# Patient Record
Sex: Male | Born: 1990 | Race: White | Hispanic: No | Marital: Single | Smoking: Never smoker
Health system: Southern US, Community
[De-identification: ages and names within clinical notes are randomized; demographics above are authoritative.]

## PROBLEM LIST (undated history)

## (undated) DIAGNOSIS — G4733 Obstructive sleep apnea (adult) (pediatric): Secondary | ICD-10-CM

## (undated) DIAGNOSIS — I480 Paroxysmal atrial fibrillation: Secondary | ICD-10-CM

## (undated) HISTORY — DX: Obstructive sleep apnea (adult) (pediatric): G47.33

## (undated) HISTORY — DX: Paroxysmal atrial fibrillation: I48.0

---

## 2020-08-02 ENCOUNTER — Emergency Department
Admission: EM | Admit: 2020-08-02 | Discharge: 2020-08-02 | Disposition: A | Discharge status: Home / Self Care | Payer: Managed Care, Other (non HMO) | Attending: Emergency Medicine | Admitting: Emergency Medicine

## 2020-08-02 ENCOUNTER — Other Ambulatory Visit: Payer: Self-pay

## 2020-08-02 ENCOUNTER — Emergency Department: Payer: Managed Care, Other (non HMO)

## 2020-08-02 DIAGNOSIS — Z20822 Contact with and (suspected) exposure to covid-19: Secondary | ICD-10-CM | POA: Diagnosis not present

## 2020-08-02 DIAGNOSIS — I480 Paroxysmal atrial fibrillation: Secondary | ICD-10-CM

## 2020-08-02 DIAGNOSIS — R002 Palpitations: Secondary | ICD-10-CM

## 2020-08-02 LAB — TROPONIN I (HIGH SENSITIVITY): Troponin I (High Sensitivity): 6 ng/L (ref <18)

## 2020-08-02 LAB — CBC
HCT: 48.8 % (ref 39.0–52.0)
Hemoglobin: 16.5 g/dL (ref 13.0–17.0)
MCH: 31.7 pg (ref 26.0–34.0)
MCHC: 33.8 g/dL (ref 30.0–36.0)
MCV: 93.8 fL (ref 80.0–100.0)
Platelets: 187 10*3/uL (ref 150–400)
RBC: 5.2 MIL/uL (ref 4.22–5.81)
RDW: 12.1 % (ref 11.5–15.5)
WBC: 5.8 10*3/uL (ref 4.0–10.5)
nRBC: 0 % (ref 0.0–0.2)

## 2020-08-02 LAB — BASIC METABOLIC PANEL
Anion gap: 11 (ref 5–15)
BUN: 10 mg/dL (ref 6–20)
CO2: 26 mmol/L (ref 22–32)
Calcium: 9.6 mg/dL (ref 8.9–10.3)
Chloride: 102 mmol/L (ref 98–111)
Creatinine, Ser: 1.05 mg/dL (ref 0.61–1.24)
GFR, Estimated: >60 mL/min (ref >60)
Glucose, Bld: 128 mg/dL — ABNORMAL HIGH (ref 70–99)
Potassium: 3.1 mmol/L — ABNORMAL LOW (ref 3.5–5.1)
Sodium: 139 mmol/L (ref 135–145)

## 2020-08-02 LAB — MAGNESIUM: Magnesium: 2.1 mg/dL (ref 1.7–2.4)

## 2020-08-02 LAB — TSH: TSH: 3.755 u[IU]/mL (ref 0.350–4.500)

## 2020-08-02 LAB — SARS CORONAVIRUS 2 (TAT 6-24 HRS): SARS Coronavirus 2: NEGATIVE

## 2020-08-02 MED ORDER — LACTATED RINGERS IV BOLUS
1000.0000 mL | Freq: Once | INTRAVENOUS | Status: AC
  Administered 2020-08-02: 1000 mL via INTRAVENOUS

## 2020-08-02 MED ORDER — POTASSIUM CHLORIDE CRYS ER 20 MEQ PO TBCR
40.0000 meq | EXTENDED_RELEASE_TABLET | Freq: Once | ORAL | Status: AC
  Administered 2020-08-02: 40 meq via ORAL
  Filled 2020-08-02: qty 2

## 2020-08-02 NOTE — ED Triage Notes (Signed)
Pt presents c/o tachycardia and palpitations. Denies chest pain.

## 2020-08-02 NOTE — ED Provider Notes (Signed)
[facility] Emergency Department Provider Note  ____________________________________________  Time seen: Approximately 12:24 PM  I have reviewed the triage vital signs and the nursing notes.   HISTORY  Chief Complaint Palpitations    HPI Kent Gibbs is a 30 y.o. male who presents to the emergency department complaining of palpitations.  Patient states that he had a sudden change earlier this morning when he felt like his heart was beating fast and heavy Nedra Hai.  Patient denied any pain with this.  Patient states that he had no shortness of breath.  He has no cardiac history.  He has not take any medications illicit or prescribed.  Patient has no close family members with a cardiac history.  He states that again it is a palpitations/racing sensation.  No shortness of breath, no cough.  No URI symptoms.  No recent illnesses.  Patient states that he does drink caffeine but does not drink an abnormal amount of caffeine prior to the onset of symptoms.  He was not doing anything out of the ordinary when they began.  While here, patient states that the sensation went away completely.  Since then he has had no return of the symptoms.  No medications prior to arrival.         No past medical history on file.  There are no problems to display for this patient.     Prior to Admission medications   Not on File    Allergies Patient has no known allergies.  No family history on file.  Social History     Review of Systems  Constitutional: No fever/chills Eyes: No visual changes. No discharge ENT: No upper respiratory complaints. Cardiovascular: Palpitations and "racing" sensation Respiratory: no cough. No SOB. Gastrointestinal: No abdominal pain.  No nausea, no vomiting.  No diarrhea.  No constipation. Musculoskeletal: Negative for musculoskeletal pain. Skin: Negative for rash, abrasions, lacerations, ecchymosis. Neurological: Negative for headaches, focal  weakness or numbness.  10 System ROS otherwise negative.  ____________________________________________   PHYSICAL EXAM:  VITAL SIGNS: ED Triage Vitals [08/02/20 1058]  Enc Vitals Group     BP (!) 135/106     Pulse Rate (!) 133     Resp 14     Temp 98.6 F (37 C)     Temp Source Oral     SpO2 100 %     Weight      Height      Head Circumference      Peak Flow      Pain Score 0     Pain Loc      Pain Edu?      Excl. in GC?      Constitutional: Alert and oriented. Well appearing and in no acute distress. Eyes: Conjunctivae are normal. PERRL. EOMI. Head: Atraumatic. ENT:      Ears:       Nose: No congestion/rhinnorhea.      Mouth/Throat: Mucous membranes are moist.  Neck: No stridor.    Cardiovascular: Normal rate, regular rhythm. Normal S1 and S2.  Good peripheral circulation. Respiratory: Normal respiratory effort without tachypnea or retractions. Lungs CTAB. Good air entry to the bases with no decreased or absent breath sounds. Gastrointestinal: Bowel sounds 4 quadrants. Soft and nontender to palpation. No guarding or rigidity. No palpable masses. No distention. No CVA tenderness. Musculoskeletal: Full range of motion to all extremities. No gross deformities appreciated. Neurologic:  Normal speech and language. No gross focal neurologic deficits are appreciated.  Skin:  Skin is warm, dry and intact. No rash noted. Psychiatric: Mood and affect are normal. Speech and behavior are normal. Patient exhibits appropriate insight and judgement.   ____________________________________________   LABS (all labs ordered are listed, but only abnormal results are displayed)  Labs Reviewed  BASIC METABOLIC PANEL - Abnormal; Notable for the following components:      Result Value   Potassium 3.1 (*)    Glucose, Bld 128 (*)    All other components within normal limits  SARS CORONAVIRUS 2 (TAT 6-24 HRS)  CBC  MAGNESIUM  TSH  URINALYSIS, COMPLETE (UACMP) WITH MICROSCOPIC   URINE DRUG SCREEN, QUALITATIVE ([facility] ONLY)  TROPONIN I (HIGH SENSITIVITY)  TROPONIN I (HIGH SENSITIVITY)   ____________________________________________  EKG  ED ECG REPORT I, Delorise Royals Verania Salberg,  personally viewed and interpreted this ECG.   Date: 08/02/2020  EKG Time: 1045 hrs.  Rate: 132 bpm  Rhythm: there are no previous tracings available for comparison, atrial fibrillation, rate 132  Axis: Normal axis  Intervals:none  ST&T Change: No ST elevation or depression noted  A. fib with RVR at a rate of 132 bpm.  No STEMI.  Patient had incomplete right bundle branch block with RSR pattern in V1 V2 with no widened QRS  ED ECG REPORT I, Delorise Royals Micky Overturf,  personally viewed and interpreted this ECG.   Date: 08/02/2020  EKG Time: 1316 hrs.  Rate: 94 bpm  Rhythm: normal sinus rhythm, no longer in A. Fib.,  RSR pattern in V1 and V2, no widened QRS  Axis: Normal axis  Intervals:none  ST&T Change: No ST elevation or depression  Normal sinus rhythm.  Compared to previous EKG earlier today no longer in A. fib.  RSR pattern still identified with no evidence of right bundle branch block.    ____________________________________________  RADIOLOGY I personally viewed and evaluated these images as part of my medical decision making, as well as reviewing the written report by the radiologist.  ED Provider Interpretation: Concur with radiologist finding of no acute cardiopulmonary abnormality  DG Chest 2 View  Result Date: 08/02/2020 CLINICAL DATA:  Acute tachycardia and palpitations. EXAM: CHEST - 2 VIEW COMPARISON:  None. FINDINGS: The cardiomediastinal silhouette is unremarkable. There is no evidence of focal airspace disease, pulmonary edema, suspicious pulmonary nodule/mass, pleural effusion, or pneumothorax. No acute bony abnormalities are identified. IMPRESSION: No active cardiopulmonary disease. Electronically Signed   By: Harmon Pier M.D.   On: 08/02/2020 11:42     ____________________________________________    PROCEDURES  Procedure(s) performed:    Procedures    Medications  potassium chloride SA (KLOR-CON) CR tablet 40 mEq (40 mEq Oral Given 08/02/20 1235)  lactated ringers bolus 1,000 mL (0 mLs Intravenous Stopped 08/02/20 1505)     ____________________________________________   INITIAL IMPRESSION / ASSESSMENT AND PLAN / ED COURSE  Pertinent labs & imaging results that were available during my care of the patient were reviewed by me and considered in my medical decision making (see chart for details).  Review of the [place] CSRS was performed in accordance of the [facility] prior to dispensing any controlled drugs.  Clinical Course as of 08/02/20 1526  Sun Aug 02, 2020  1519 Patient arrived with palpitations and heart racing sensation.  No describe chest pain.  No cardiac history.  No medications.  Patient arrived with A. fib with a rate of 132 bpm.  On my assessment, patient states that sensation had resolved.  Monitor showed a normal sinus rhythm with a rate  of 82.  Patient had a repeat EKG which revealed sinus rhythm at a rate of 94 bpm.  Patient has maintained with a pulse rate between 76 and 84 and normal sinus rhythm throughout the remainder of his visit. [JC]    Clinical Course User Index [JC] Beretta Ginsberg, Delorise Royals, PA-C          Patient's diagnosis is consistent with palpitations, paroxysmal A. fib.  Patient presented to emergency department with palpitations and rapid heart rate.  Patient had A. fib on initial EKG.  Patient had spontaneous conversion without medication or other interventions.  He states that symptoms had gone away on my assessment of the patient.  Patient had remained at a heart rate between 76 and 84 bpm and normal sinus rhythm for the duration of his ED visit.  Remainder of the work-up including labs, EKG and chest x-ray was reassuring.  At this time I will not start the patient on anticoagulation as he  spontaneous converted out of A. fib.  No history of A. fib in the past.  Patient did have slightly low potassium and was given oral resupplementation here in the emergency department.  Patient was encouraged to use a potassium-containing diet at home.  Given strict return precautions to the patient to return for any new or worsening symptoms.  Otherwise follow-up with cardiology. Patient is given ED precautions to return to the ED for any worsening or new symptoms.     ____________________________________________  FINAL CLINICAL IMPRESSION(S) / ED DIAGNOSES  Final diagnoses:  Palpitations  Paroxysmal A-fib (HCC)      NEW MEDICATIONS STARTED DURING THIS VISIT:  ED Discharge Orders    None          This chart was dictated using voice recognition software/Dragon. Despite best efforts to proofread, errors can occur which can change the meaning. Any change was purely unintentional.    Racheal Patches, PA-C 08/02/20 1526    Gilles Chiquito, MD 08/02/20 [phone]

## 2020-08-14 ENCOUNTER — Ambulatory Visit (INDEPENDENT_AMBULATORY_CARE_PROVIDER_SITE_OTHER): Payer: Managed Care, Other (non HMO)

## 2020-08-14 ENCOUNTER — Ambulatory Visit: Payer: Managed Care, Other (non HMO) | Admitting: Cardiology

## 2020-08-14 ENCOUNTER — Encounter: Payer: Self-pay | Admitting: Cardiology

## 2020-08-14 ENCOUNTER — Other Ambulatory Visit: Payer: Self-pay

## 2020-08-14 VITALS — BP 140/70 | HR 78 | Ht 76.0 in | Wt 177.0 lb

## 2020-08-14 DIAGNOSIS — I480 Paroxysmal atrial fibrillation: Secondary | ICD-10-CM

## 2020-08-14 NOTE — Patient Instructions (Signed)
Medication Instructions:  Your physician has recommended you make the following change in your medication:   1.  START Toprol XL 25 MG once daily.   *If you need a refill on your cardiac medications before your next appointment, please call your pharmacy*   Lab Work: None ordered If you have labs (blood work) drawn today and your tests are completely normal, you will receive your results only by: Marland Kitchen MyChart Message (if you have MyChart) OR . A paper copy in the mail If you have any lab test that is abnormal or we need to change your treatment, we will call you to review the results.   Testing/Procedures:  1.  Your physician has requested that you have an echocardiogram. Echocardiography is a painless test that uses sound waves to create images of your heart. It provides your doctor with information about the size and shape of your heart and how well your heart's chambers and valves are working. This procedure takes approximately one hour. There are no restrictions for this procedure.  2.  Your physician has recommended that you wear a Zio monitor for 2 weeks. This monitor is a medical device that records the heart's electrical activity. Doctors most often use these monitors to diagnose arrhythmias. Arrhythmias are problems with the speed or rhythm of the heartbeat. The monitor is a small device applied to your chest. You can wear one while you do your normal daily activities. While wearing this monitor if you have any symptoms to push the button and record what you felt. Once you have worn this monitor for the period of time provider prescribed (Usually 14 days), you will return the monitor device in the postage paid box. Once it is returned they will download the data collected and provide [place] with a report which the provider will then review and we will call you with those results. Important tips:  1. Avoid showering during the first 24 hours of wearing the monitor. 2. Avoid excessive  sweating to help maximize wear time. 3. Do not submerge the device, no hot tubs, and no swimming pools. 4. Keep any lotions or oils away from the patch. 5. After 24 hours you may shower with the patch on. Take brief showers with your back facing the shower head.  6. Do not remove patch once it has been placed because that will interrupt data and decrease adhesive wear time. 7. Push the button when you have any symptoms and write down what you were feeling. 8. Once you have completed wearing your monitor, remove and place into box which has postage paid and place in your outgoing mailbox.  9. If for some reason you have misplaced your box then call our office and we can provide another box and/or mail it off for you.           Follow-Up: At [facility], you and your health needs are our priority.  As part of our continuing mission to provide you with exceptional heart care, we have created designated Provider Care Teams.  These Care Teams include your primary Cardiologist (physician) and Advanced Practice Providers (APPs -  Physician Assistants and Nurse Practitioners) who all work together to provide you with the care you need, when you need it.  We recommend signing up for the patient portal called "MyChart".  Sign up information is provided on this After Visit Summary.  MyChart is used to connect with patients for Virtual Visits (Telemedicine).  Patients are able to view lab/test results,  upcoming appointments, etc.  Non-urgent messages can be sent to your provider as well.   To learn more about what you can do with MyChart, go to [email or web address].    Your next appointment:   6 week(s)  The format for your next appointment:   In Person  Provider:   Brian Agbor-Etang, MD   Other Instructions   

## 2020-08-14 NOTE — Progress Notes (Signed)
Cardiology Office Note:    Date:  08/14/2020   ID:  Kent Gibbs, DOB [birth year], MRN [id number]  PCP:  [facility]  Cardiologist:  No primary care provider on file.  Advanced Practice Provider:  No care team member to display Electrophysiologist:  None       Referring MD: Gilles Chiquito, MD   Chief Complaint  Patient presents with  . Other    Follow up ED visit - Afib and palpitations. Meds reviewed verbally with patient.    Kent Gibbs is a 30 y.o. male who is being seen today for the evaluation of palpitations at the request of Gilles Chiquito, MD.   History of Present Illness:    Kent Gibbs is a 30 y.o. male with no significant past medical history who presents due to palpitations.  Patient was seen in the ED about 2 weeks ago on 08/02/2020 due to palpitations.  He was at home when he suddenly felt his heart racing.  Symptoms of tachycardia/palpitations were associated with dizziness.  Denies chest pain or shortness of breath.  Symptoms lasted about 3 hours.  In the ED, EKG showed atrial fibrillation with heart rate 132.  Patient spontaneously converted to normal sinus rhythm while in the emergency room.  Has a low CHA2DS2-VASc score of 0.  No anticoagulation was indicated.  States having palpitations after developing Covid in January 2021.   History reviewed. No pertinent past medical history.  History reviewed. No pertinent surgical history.  Current Medications: No outpatient medications have been marked as taking for the 08/14/20 encounter (Office Visit) with Debbe Odea, MD.     Allergies:   Patient has no known allergies.   Social History   Socioeconomic History  . Marital status: Single    Spouse name: Not on file  . Number of children: Not on file  . Years of education: Not on file  . Highest education level: Not on file  Occupational History  . Not on file  Tobacco Use  . Smoking status: Never Smoker  .  Smokeless tobacco: Never Used  Vaping Use  . Vaping Use: Never used  Substance and Sexual Activity  . Alcohol use: Never  . Drug use: Never  . Sexual activity: Not on file  Other Topics Concern  . Not on file  Social History Narrative  . Not on file   Social Determinants of Health   Financial Resource Strain: Not on file  Food Insecurity: Not on file  Transportation Needs: Not on file  Physical Activity: Not on file  Stress: Not on file  Social Connections: Not on file     Family History: The patient's family history is not on file.  ROS:   Please see the history of present illness.     All other systems reviewed and are negative.  EKGs/Labs/Other Studies Reviewed:    The following studies were reviewed today:   EKG:  EKG is  ordered today.  The ekg ordered today demonstrates normal sinus rhythm, normal ECG.  Recent Labs: 08/02/2020: BUN 10; Creatinine, Ser 1.05; Hemoglobin 16.5; Magnesium 2.1; Platelets 187; Potassium 3.1; Sodium 139; TSH 3.755  Recent Lipid Panel No results found for: CHOL, TRIG, HDL, CHOLHDL, VLDL, LDLCALC, LDLDIRECT   Risk Assessment/Calculations:      Physical Exam:    VS:  BP 140/70 (BP Location: Left Arm, Patient Position: Sitting, Cuff Size: Normal)   Pulse 78   Ht 6\' 4"  (1.93 m)  Wt 177 lb (80.3 kg)   SpO2 98%   BMI 21.55 kg/m     Wt Readings from Last 3 Encounters:  08/14/20 177 lb (80.3 kg)     GEN:  Well nourished, well developed in no acute distress HEENT: Normal NECK: No JVD; No carotid bruits LYMPHATICS: No lymphadenopathy CARDIAC: RRR, no murmurs, rubs, gallops RESPIRATORY:  Clear to auscultation without rales, wheezing or rhonchi  ABDOMEN: Soft, non-tender, non-distended MUSCULOSKELETAL:  No edema; No deformity  SKIN: Warm and dry NEUROLOGIC:  Alert and oriented x 3 PSYCHIATRIC:  Normal affect   ASSESSMENT:    1. Paroxysmal atrial fibrillation (HCC)    PLAN:    In order of problems listed  above:  1. Paroxysmal atrial fibrillation, history of palpitations.  CHA2DS2-VASc score 0.  Get echocardiogram.  No indication for anticoagulation at this point due to low CHA2DS2-VASc score.  Start Toprol-XL 25 mg daily to suppress arrhythmia recurrence.  Place cardiac monitor to evaluate A. fib burden.  Since patient has very symptomatic symptoms when in A. fib, also very young, will refer to EP for additional input regarding antiarrhythmic therapy and/or ablation.   Follow-up after echocardiogram, cardiac monitor.     Medication Adjustments/Labs and Tests Ordered: Current medicines are reviewed at length with the patient today.  Concerns regarding medicines are outlined above.  Orders Placed This Encounter  Procedures  . Ambulatory referral to Cardiac Electrophysiology  . LONG TERM MONITOR (3-14 DAYS)  . EKG 12-Lead  . ECHOCARDIOGRAM COMPLETE   No orders of the defined types were placed in this encounter.   Patient Instructions  Medication Instructions:  Your physician has recommended you make the following change in your medication:   1.  START Toprol XL 25 MG once daily.   *If you need a refill on your cardiac medications before your next appointment, please call your pharmacy*   Lab Work: None ordered If you have labs (blood work) drawn today and your tests are completely normal, you will receive your results only by: Marland Kitchen MyChart Message (if you have MyChart) OR . A paper copy in the mail If you have any lab test that is abnormal or we need to change your treatment, we will call you to review the results.   Testing/Procedures:  1.  Your physician has requested that you have an echocardiogram. Echocardiography is a painless test that uses sound waves to create images of your heart. It provides your doctor with information about the size and shape of your heart and how well your heart's chambers and valves are working. This procedure takes approximately one hour. There are  no restrictions for this procedure.  2.  Your physician has recommended that you wear a Zio monitor for 2 weeks. This monitor is a medical device that records the heart's electrical activity. Doctors most often use these monitors to diagnose arrhythmias. Arrhythmias are problems with the speed or rhythm of the heartbeat. The monitor is a small device applied to your chest. You can wear one while you do your normal daily activities. While wearing this monitor if you have any symptoms to push the button and record what you felt. Once you have worn this monitor for the period of time provider prescribed (Usually 14 days), you will return the monitor device in the postage paid box. Once it is returned they will download the data collected and provide [place] with a report which the provider will then review and we will call you with those results. Important  tips:  1. Avoid showering during the first 24 hours of wearing the monitor. 2. Avoid excessive sweating to help maximize wear time. 3. Do not submerge the device, no hot tubs, and no swimming pools. 4. Keep any lotions or oils away from the patch. 5. After 24 hours you may shower with the patch on. Take brief showers with your back facing the shower head.  6. Do not remove patch once it has been placed because that will interrupt data and decrease adhesive wear time. 7. Push the button when you have any symptoms and write down what you were feeling. 8. Once you have completed wearing your monitor, remove and place into box which has postage paid and place in your outgoing mailbox.  9. If for some reason you have misplaced your box then call our office and we can provide another box and/or mail it off for you.           Follow-Up: At [facility], you and your health needs are our priority.  As part of our continuing mission to provide you with exceptional heart care, we have created designated Provider Care Teams.  These Care Teams include your  primary Cardiologist (physician) and Advanced Practice Providers (APPs -  Physician Assistants and Nurse Practitioners) who all work together to provide you with the care you need, when you need it.  We recommend signing up for the patient portal called "MyChart".  Sign up information is provided on this After Visit Summary.  MyChart is used to connect with patients for Virtual Visits (Telemedicine).  Patients are able to view lab/test results, encounter notes, upcoming appointments, etc.  Non-urgent messages can be sent to your provider as well.   To learn more about what you can do with MyChart, go to [email or web address].    Your next appointment:   6 week(s)  The format for your next appointment:   In Person  Provider:   Debbe Odea, MD   Other Instructions      Signed, Debbe Odea, MD  08/14/2020 1:02 PM    [facility]

## 2020-08-17 ENCOUNTER — Telehealth: Payer: Self-pay | Admitting: Cardiology

## 2020-08-17 MED ORDER — METOPROLOL SUCCINATE ER 25 MG PO TB24: 25.0000 mg | ORAL_TABLET | Freq: Every day | ORAL | 5 refills | Status: DC

## 2020-08-17 NOTE — Telephone Encounter (Signed)
*  STAT* If patient is at the pharmacy, call can be transferred to refill team.   1. Which medications need to be refilled? (please list name of each medication and dose if known) Toprolol 25 mg  2. Which pharmacy/location (including street and city if local pharmacy) is medication to be sent to? [facility] on [address]  3. Do they need a 30 day or 90 day supply? 30  Patient was seen on 3/4 and medication was not called in after visit

## 2020-08-17 NOTE — Telephone Encounter (Signed)
Called patient and informed him that the prescription has been sent in.

## 2020-09-03 ENCOUNTER — Other Ambulatory Visit: Payer: Self-pay

## 2020-09-03 ENCOUNTER — Ambulatory Visit (INDEPENDENT_AMBULATORY_CARE_PROVIDER_SITE_OTHER): Payer: Managed Care, Other (non HMO)

## 2020-09-03 DIAGNOSIS — I480 Paroxysmal atrial fibrillation: Secondary | ICD-10-CM

## 2020-09-03 LAB — ECHOCARDIOGRAM COMPLETE
AR max vel: 3.77 cm2
AV Area VTI: 3.97 cm2
AV Area mean vel: 3.61 cm2
AV Mean grad: 3 mmHg
AV Peak grad: 4.7 mmHg
Ao pk vel: 1.08 m/s
Area-P 1/2: 3.91 cm2

## 2020-09-03 MED ORDER — PERFLUTREN LIPID MICROSPHERE
1.0000 mL | INTRAVENOUS | Status: AC | PRN
  Administered 2020-09-03: 2 mL via INTRAVENOUS

## 2020-09-07 ENCOUNTER — Telehealth: Payer: Self-pay

## 2020-09-07 NOTE — Telephone Encounter (Signed)
Called and left patient a VM to call back so I can give the echo result note.

## 2020-09-07 NOTE — Telephone Encounter (Signed)
Patient called back and was given the echo result note.

## 2020-09-09 ENCOUNTER — Ambulatory Visit: Payer: Managed Care, Other (non HMO) | Admitting: Cardiology

## 2020-09-09 ENCOUNTER — Telehealth: Payer: Self-pay

## 2020-09-09 ENCOUNTER — Encounter: Payer: Self-pay | Admitting: Cardiology

## 2020-09-09 VITALS — BP 132/68 | HR 83 | Ht 76.0 in | Wt 177.5 lb

## 2020-09-09 DIAGNOSIS — I480 Paroxysmal atrial fibrillation: Secondary | ICD-10-CM

## 2020-09-09 NOTE — Telephone Encounter (Signed)
   Date:  09/09/2020 STOP BANG RISK ASSESSMENT S (snore) Have you been told that you snore?     NO   T (tired) Are you often tired, fatigued, or sleepy during the day?   YES  O (obstruction) Do you stop breathing, choke, or gasp during sleep? NO   P (pressure) Do you have or are you being treated for high blood pressure? NO   B (BMI) Is your body index greater than 35 kg/m? NO   A (age) Are you 29 years old or older? NO   N (neck) Do you have a neck circumference greater than 16 inches?   YES   G (gender) Are you a male? YES   TOTAL STOP/BANG "YES" ANSWERS                                                                        For Office Use Only              Procedure Order Form    YES to 3+ Stop Bang questions OR two clinical symptoms - patient qualifies for WatchPAT (CPT 95800)

## 2020-09-09 NOTE — Patient Instructions (Addendum)
Medication Instructions:  Your physician recommends that you continue on your current medications as directed. Please refer to the Current Medication list given to you today. *If you need a refill on your cardiac medications before your next appointment, please call your pharmacy*  Lab Work: None ordered. If you have labs (blood work) drawn today and your tests are completely normal, you will receive your results only by: Marland Kitchen MyChart Message (if you have MyChart) OR . A paper copy in the mail If you have any lab test that is abnormal or we need to change your treatment, we will call you to review the results.  Testing/Procedures: Your physician has recommended that you have a sleep study. This test records several body functions during sleep, including: brain activity, eye movement, oxygen and carbon dioxide blood levels, heart rate and rhythm, breathing rate and rhythm, the flow of air through your mouth and nose, snoring, body muscle movements, and chest and belly movement.  Follow-Up: At [facility], you and your health needs are our priority.  As part of our continuing mission to provide you with exceptional heart care, we have created designated Provider Care Teams.  These Care Teams include your primary Cardiologist (physician) and Advanced Practice Providers (APPs -  Physician Assistants and Nurse Practitioners) who all work together to provide you with the care you need, when you need it.  Your next appointment:   Your physician wants you to follow-up in: 6 months with Dr. Lalla Brothers.

## 2020-09-09 NOTE — Telephone Encounter (Signed)
Order placed for sleep study  Sent to precert  Will continue to follow.

## 2020-09-09 NOTE — Progress Notes (Signed)
Electrophysiology Office Note:    Date:  09/09/2020   ID:  Kent Gibbs, DOB January 22, 1991, MRN [id number]  PCP:  Pcp, No  [facility] Cardiologist:  No primary care provider on file.  [facility] Electrophysiologist:  Lanier Prude, MD   Referring MD: Debbe Odea, MD   Chief Complaint: Paroxysmal atrial fibrillation  History of Present Illness:    Kent Gibbs is a 30 y.o. male who presents for an evaluation of paroxysmal atrial fibrillation at the request of Dr. Azucena Cecil.  The medical history includes a COVID-19 infection in January 2021.  Since his COVID-19 infection, he has had palpitations.  His CHA2DS2-VASc is 0.  He was last seen by Dr. Azucena Cecil on August 14, 2020.  At that appointment, a ZIO monitor was ordered which showed a 10% burden of atrial fibrillation with ventricular rates ranging from 58 to 192 bpm.  Longest episode lasted 7.5 hours.  Patient triggered episodes corresponded to episodes of atrial fibrillation.  Patient tells me he is a bit of a night owl.  He is symptomatic when he is in atrial fibrillation and feels like his heart is racing.  No syncope or lightheadedness.  He tells me that for the last week and a half he has not had any episodes of atrial fibrillation.  He is unsure whether or not he snores at night. No past medical history on file.  No past surgical history on file.  Current Medications: Current Meds  Medication Sig  . metoprolol succinate (TOPROL XL) 25 MG 24 hr tablet Take 1 tablet (25 mg total) by mouth daily.     Allergies:   Patient has no known allergies.   Social History   Socioeconomic History  . Marital status: Single    Spouse name: Not on file  . Number of children: Not on file  . Years of education: Not on file  . Highest education level: Not on file  Occupational History  . Not on file  Tobacco Use  . Smoking status: Never Smoker  . Smokeless tobacco: Never Used  Vaping Use  . Vaping Use: Never used   Substance and Sexual Activity  . Alcohol use: Never  . Drug use: Never  . Sexual activity: Not on file  Other Topics Concern  . Not on file  Social History Narrative  . Not on file   Social Determinants of Health   Financial Resource Strain: Not on file  Food Insecurity: Not on file  Transportation Needs: Not on file  Physical Activity: Not on file  Stress: Not on file  Social Connections: Not on file     Family History: The patient's family history is not on file.  ROS:   Please see the history of present illness.    All other systems reviewed and are negative.  EKGs/Labs/Other Studies Reviewed:    The following studies were reviewed today:  September 04, 2020 ZIO monitor personally reviewed Patient had a min HR of 46 bpm, max HR of 192 bpm, and avg HR of 77 bpm. Predominant underlying rhythm was Sinus Rhythm. Atrial Fibrillation occurred (10% burden), ranging from 58-192 bpm (avg of 109 bpm), the longest lasting 7 hours 30 mins with an avg  rate of 98 bpm. Atrial Fibrillation was detected within +/- 45 seconds of symptomatic patient event(s). Isolated SVEs were rare (<1.0%), SVE Couplets were rare (<1.0%), and SVE Triplets were rare (<1.0%). Isolated VEs were rare (<1.0%), VE Couplets were  rare (<1.0%), and no VE Triplets were  present.    Heart rate variability shows a wide fluctuation in heart rates at night.    September 03, 2020 echo personally reviewed Left ventricular function normal, 60% Right ventricular function normal No significant valvular abnormalities   EKG:  The ekg ordered today demonstrates sinus rhythm with sinus arrhythmia, incomplete right bundle branch block  Recent Labs: 08/02/2020: BUN 10; Creatinine, Ser 1.05; Hemoglobin 16.5; Magnesium 2.1; Platelets 187; Potassium 3.1; Sodium 139; TSH 3.755  Recent Lipid Panel No results found for: CHOL, TRIG, HDL, CHOLHDL, VLDL, LDLCALC, LDLDIRECT  Physical Exam:    VS:  BP 132/68 (BP Location: Left Arm,  Patient Position: Sitting, Cuff Size: Normal)   Pulse 83   Ht 6\' 4"  (1.93 m)   Wt 177 lb 8 oz (80.5 kg)   SpO2 99%   BMI 21.61 kg/m     Wt Readings from Last 3 Encounters:  09/09/20 177 lb 8 oz (80.5 kg)  08/14/20 177 lb (80.3 kg)     GEN:  Well nourished, well developed in no acute distress HEENT: Normal NECK: No JVD; No carotid bruits LYMPHATICS: No lymphadenopathy CARDIAC: RRR, no murmurs, rubs, gallops RESPIRATORY:  Clear to auscultation without rales, wheezing or rhonchi  ABDOMEN: Soft, non-tender, non-distended MUSCULOSKELETAL:  No edema; No deformity  SKIN: Warm and dry NEUROLOGIC:  Alert and oriented x 3 PSYCHIATRIC:  Normal affect   ASSESSMENT:    1. Paroxysmal atrial fibrillation (HCC)    PLAN:    In order of problems listed above:  1. Paroxysmal atrial fibrillation Symptomatic.  10% burden. During today's visit, discussed the management options available for treating his paroxysmal atrial fibrillation including continued conservative management, antiarrhythmic therapy or ablation therapy.  I discussed the risks and benefits and expected efficacy of the 3 options.  For now, we will plan to continue conservative management with metoprolol given he is not had any episodes of atrial fibrillation in the last 1 and half weeks.  We will plan to touch base in 6 months.  Given the wide fluctuation in his heart rates at night, we will plan to order a sleep study.  I have encouraged him to pursue some form of at home heart rhythm monitoring.  We discussed alive core, apple watch, Fitbit.  I think this would help guide future management decisions.  Follow-up 6 months.   Total time spent with patient today 65 minutes. This includes reviewing records, evaluating the patient and coordinating care.  Medication Adjustments/Labs and Tests Ordered: Current medicines are reviewed at length with the patient today.  Concerns regarding medicines are outlined above.  Orders  Placed This Encounter  Procedures  . EKG 12-Lead   No orders of the defined types were placed in this encounter.    Signed, 10/14/20, MD, [facility]  09/09/2020 2:29 PM    Electrophysiology [facility]

## 2020-09-10 NOTE — Telephone Encounter (Signed)
3.31.2022 10:50 Call to [employer], no precert required for CPT 95800.  Confirmation # [id number].  Patient notified, PIN provided, instructions reviewed, patient denied additional questions and will proceed with testing.  Jim Like MHA RN CCM

## 2020-09-11 ENCOUNTER — Encounter (INDEPENDENT_AMBULATORY_CARE_PROVIDER_SITE_OTHER): Payer: Managed Care, Other (non HMO) | Admitting: Cardiology

## 2020-09-11 DIAGNOSIS — G4733 Obstructive sleep apnea (adult) (pediatric): Secondary | ICD-10-CM

## 2020-09-14 ENCOUNTER — Ambulatory Visit: Payer: Managed Care, Other (non HMO)

## 2020-09-14 DIAGNOSIS — I480 Paroxysmal atrial fibrillation: Secondary | ICD-10-CM

## 2020-09-14 NOTE — Procedures (Signed)
   Sleep Study Report  Patient Information  Name: Kent Gibbs  ID: [id number] Birth Date: [birth year]  Age: 30  Gender:Male Insurer: BMI: 21.5 (W=176 lb, H=6' 4'') Study Date:09/11/2020 Referring Physician:  Steffanie Dunn, MD  TEST DESCRIPTION: Home sleep apnea testing was completed using the WatchPat, a Type 1 device, utilizing  peripheral arterial tonometry (PAT), chest movement, actigraphy, pulse oximetry, pulse rate, body position and snore.  AHI was calculated with apnea and hypopnea using valid sleep time as the denominator. RDI includes apneas,  hypopneas, and RERAs. The data acquired and the scoring of sleep and all associated events were performed in  accordance with the recommended standards and specifications as outlined in the AASM Manual for the Scoring of  Sleep and Associated Events 2.2.0 (2015). FINDINGS: 1. Mild Obstructive Sleep Apnea with AHI 8/hr.  2. NoCentral Sleep Apnea with pAHIc 0/hr. 3. Oxygen desaturations as low as 88%. 4. Mild snoring was present. O2 sats were < 88% for . 5. Total sleep time was 5hrs and 40 min. 6. 23.5% of total sleep time was spent in REM sleep.  7. Delayed sleep onset latency at 30 min.  8. Prolonged REM sleep onset latency at 169 min.  9. Total awakenings were 6.   DIAGNOSIS:  Mild Obstructive Sleep Apnea (G47.33)  RECOMMENDATIONS: 1. Clinical correlation of these findings is necessary. The decision to treat obstructive sleep apnea (OSA) is usually  based on the presence of apnea symptoms or the presence of associated medical conditions such as Hypertension,  Congestive Heart Failure, Atrial Fibrillation or Obesity. The most common symptoms of OSA are snoring, gasping for  breath while sleeping, daytime sleepiness and fatigue.   2. Initiating apnea therapy is recommended given the presence of symptoms and/or associated conditions.   Recommend proceeding with one of the following:  a. Auto-CPAP therapy with a  pressure range of 5-20cm H2O.   b. An oral appliance (OA) that can be obtained from certain dentists with expertise in sleep medicine. These are  primarily of use in non-obese patients with mild and moderate disease.   c. An ENT consultation which may be useful to look for specific causes of obstruction and possible treatment  Options.   d. If patient is intolerant to PAP therapy, consider referral to ENT for evaluation for hypoglossal nerve stimulator.  3. Close follow-up is necessary to ensure success with CPAP or oral appliance therapy for maximum benefit .  4. A follow-up oximetry study on CPAP is recommended to assess the adequacy of therapy and determine the need  for supplemental oxygen or the potential need for Bi-level therapy. An arterial blood gas to determine the adequacy of  baseline ventilation and oxygenation should also be considered.  5. Healthy sleep recommendations include: adequate nightly sleep (normal 7-9 hrs/night), avoidance of caffeine after  noon and alcohol near bedtime, and maintaining a sleep environment that is cool, dark and quiet.  6. Weight loss for overweight patients is recommended. Even modest amounts of weight loss can significantly  improve the severity of sleep apnea.  7. Snoring recommendations include: weight loss where appropriate, side sleeping, and avoidance of alcohol before  Bed.  8. Operation of motor vehicle or dangerous equipment must be avoided when feeling drowsy, excessively sleepy, or  mentally fatigued.  Report prepared by:  Signature: Armanda Magic, MD [facility], Diplomat American Board of Sleep Medicine  Electronically Signed: Apr 4, 202

## 2020-09-18 ENCOUNTER — Telehealth: Payer: Self-pay

## 2020-09-18 NOTE — Telephone Encounter (Signed)
Patient calling back stating he saw his results in [place] and does not have any further questions

## 2020-09-18 NOTE — Telephone Encounter (Signed)
Margrett Rud, [place]  09/18/2020 4:25 PM EDT Back to Top     No answer. LMOV. Phone Note Started.    Debbe Odea, MD  09/18/2020 1:13 PM EDT      A. fib noted on cardiac monitor, 10% burden, associated with patient triggered events. Keep follow-up appointment with myself and also electrophysiology.

## 2020-09-18 NOTE — Telephone Encounter (Signed)
Noted  

## 2020-09-22 ENCOUNTER — Telehealth: Payer: Self-pay | Admitting: *Deleted

## 2020-09-22 DIAGNOSIS — G4733 Obstructive sleep apnea (adult) (pediatric): Secondary | ICD-10-CM

## 2020-09-22 NOTE — Telephone Encounter (Signed)
-----   Message from Quintella Reichert, MD sent at 09/14/2020  5:12 PM EDT ----- Please let patient know that they have sleep apnea.  Order ResMed auto CPAP from 4 to 15cm H2O with heated humidity and mask of choice. Followup with me in 6 weeks.

## 2020-09-22 NOTE — Telephone Encounter (Signed)
Informed patient of sleep study results and patient understanding was verbalized. Patient understands his sleep study showed they have sleep apnea.  Dr says Order ResMed auto CPAP from 4 to 15cm H2O with heated humidity and mask of choice. Followup with me in 6 weeks.     Upon patient request DME selection is [facility]. Patient understands she/he will be contacted by [facility] to set up her/he cpap. Patient understands to call if [facility] does not contact her/he with new setup in a timely manner. Patient understands they will be called once confirmation has been received from [facility] that they have received their new machine to schedule 10 week follow up appointment.   [facility] notified of new cpap order  Please add to airview Patient was grateful for the call and thanked me.

## 2020-09-25 ENCOUNTER — Encounter: Payer: Self-pay | Admitting: Cardiology

## 2020-09-25 ENCOUNTER — Ambulatory Visit (INDEPENDENT_AMBULATORY_CARE_PROVIDER_SITE_OTHER): Payer: Managed Care, Other (non HMO) | Admitting: Cardiology

## 2020-09-25 ENCOUNTER — Other Ambulatory Visit: Payer: Self-pay

## 2020-09-25 VITALS — BP 120/66 | HR 76 | Ht 76.0 in | Wt 176.0 lb

## 2020-09-25 DIAGNOSIS — I480 Paroxysmal atrial fibrillation: Secondary | ICD-10-CM | POA: Diagnosis not present

## 2020-09-25 DIAGNOSIS — G4733 Obstructive sleep apnea (adult) (pediatric): Secondary | ICD-10-CM | POA: Diagnosis not present

## 2020-09-25 NOTE — Progress Notes (Signed)
Cardiology Office Note:    Date:  09/25/2020   ID:  Kent Gibbs, DOB 06-Mar-1991, MRN [id number]  PCP:  [facility]  Cardiologist:  No primary care provider on file.  Advanced Practice Provider:  No care team member to display Electrophysiologist:  Lanier Prude, MD       Referring MD: No ref. provider found   Chief Complaint  Patient presents with  . Other    6 week follow up. Meds reviewed verbally with patient.     History of Present Illness:    Kent Gibbs is a 30 y.o. male with history of paroxysmal atrial fibrillation, OSA who presents for follow-up.  Previously seen due to palpitations and A. fib.  CHA2DS2-VASc score of 0, not on anticoagulation.  Cardiac monitor was placed, echocardiogram was ordered to evaluate any significant cardiac abnormalities.  Was also evaluated by EP due to symptomatic A. fib.  Symptoms seem to have improved, conservative measures with Toprol-XL advised.  Has no concerns currently.  States rarely having symptoms of palpitations.  Due to A. fib, sleep study was ordered which revealed mild OSA, currently in the process of obtaining CPAP machine..   Prior notes  in the ED, EKG showed atrial fibrillation with heart rate 132.  Patient spontaneously converted to normal sinus rhythm while in the emergency room.  Has a low CHA2DS2-VASc score of 0.  No anticoagulation was indicated.  States having palpitations after developing Covid in January 2021.   History reviewed. No pertinent past medical history.  History reviewed. No pertinent surgical history.  Current Medications: Current Meds  Medication Sig  . metoprolol succinate (TOPROL XL) 25 MG 24 hr tablet Take 1 tablet (25 mg total) by mouth daily.     Allergies:   Patient has no known allergies.   Social History   Socioeconomic History  . Marital status: Single    Spouse name: Not on file  . Number of children: Not on file  . Years of education: Not on  file  . Highest education level: Not on file  Occupational History  . Not on file  Tobacco Use  . Smoking status: Never Smoker  . Smokeless tobacco: Never Used  Vaping Use  . Vaping Use: Never used  Substance and Sexual Activity  . Alcohol use: Never  . Drug use: Never  . Sexual activity: Not on file  Other Topics Concern  . Not on file  Social History Narrative  . Not on file   Social Determinants of Health   Financial Resource Strain: Not on file  Food Insecurity: Not on file  Transportation Needs: Not on file  Physical Activity: Not on file  Stress: Not on file  Social Connections: Not on file     Family History: The patient's family history is not on file.  ROS:   Please see the history of present illness.     All other systems reviewed and are negative.  EKGs/Labs/Other Studies Reviewed:    The following studies were reviewed today:   EKG:  EKG not  ordered today.   Recent Labs: 08/02/2020: BUN 10; Creatinine, Ser 1.05; Hemoglobin 16.5; Magnesium 2.1; Platelets 187; Potassium 3.1; Sodium 139; TSH 3.755  Recent Lipid Panel No results found for: CHOL, TRIG, HDL, CHOLHDL, VLDL, LDLCALC, LDLDIRECT   Risk Assessment/Calculations:      Physical Exam:    VS:  BP 120/66 (BP Location: Left Arm, Patient Position: Sitting, Cuff Size: Normal)  Pulse 76   Ht 6\' 4"  (1.93 m)   Wt 176 lb (79.8 kg)   SpO2 99%   BMI 21.42 kg/m     Wt Readings from Last 3 Encounters:  09/25/20 176 lb (79.8 kg)  09/09/20 177 lb 8 oz (80.5 kg)  08/14/20 177 lb (80.3 kg)     GEN:  Well nourished, well developed in no acute distress HEENT: Normal NECK: No JVD; No carotid bruits LYMPHATICS: No lymphadenopathy CARDIAC: RRR, no murmurs, rubs, gallops RESPIRATORY:  Clear to auscultation without rales, wheezing or rhonchi  ABDOMEN: Soft, non-tender, non-distended MUSCULOSKELETAL:  No edema; No deformity  SKIN: Warm and dry NEUROLOGIC:  Alert and oriented x 3 PSYCHIATRIC:  Normal  affect   ASSESSMENT:    1. Paroxysmal atrial fibrillation (HCC)   2. OSA (obstructive sleep apnea)    PLAN:    In order of problems listed above:  1. Paroxysmal atrial fibrillation, history of palpitations.  CHA2DS2-VASc score 0.  Echocardiogram showed normal systolic and diastolic function, EF 60 to 65%.  Cardiac monitor did reveal A. fib, 10% burden.  Appreciate input from EP.  Continue conservative management with Toprol-XL.  Treatment of OSA with CPAP will help A. fib burden. 2. OSA, management with CPAP recommended.  Follow-up in 12 months.     Medication Adjustments/Labs and Tests Ordered: Current medicines are reviewed at length with the patient today.  Concerns regarding medicines are outlined above.  No orders of the defined types were placed in this encounter.  No orders of the defined types were placed in this encounter.   Patient Instructions  Medication Instructions:  Your physician recommends that you continue on your current medications as directed. Please refer to the Current Medication list given to you today.  *If you need a refill on your cardiac medications before your next appointment, please call your pharmacy*   Lab Work: None ordered If you have labs (blood work) drawn today and your tests are completely normal, you will receive your results only by: 10/14/20 MyChart Message (if you have MyChart) OR . A paper copy in the mail If you have any lab test that is abnormal or we need to change your treatment, we will call you to review the results.   Testing/Procedures: None ordered   Follow-Up: At [facility], you and your health needs are our priority.  As part of our continuing mission to provide you with exceptional heart care, we have created designated Provider Care Teams.  These Care Teams include your primary Cardiologist (physician) and Advanced Practice Providers (APPs -  Physician Assistants and Nurse Practitioners) who all work together to  provide you with the care you need, when you need it.  We recommend signing up for the patient portal called "MyChart".  Sign up information is provided on this After Visit Summary.  MyChart is used to connect with patients for Virtual Visits (Telemedicine).  Patients are able to view lab/test results, encounter notes, upcoming appointments, etc.  Non-urgent messages can be sent to your provider as well.   To learn more about what you can do with MyChart, go to [facility].    Your next appointment:   1 year(s)  The format for your next appointment:   In Person  Provider:   [email or web address], MD   Other Instructions       Signed, Debbe Odea, MD  09/25/2020 4:58 PM    [facility]

## 2020-09-25 NOTE — Patient Instructions (Signed)

## 2021-01-04 ENCOUNTER — Other Ambulatory Visit: Payer: Self-pay | Admitting: *Deleted

## 2021-01-04 MED ORDER — METOPROLOL SUCCINATE ER 25 MG PO TB24: 25.0000 mg | ORAL_TABLET | Freq: Every day | ORAL | 5 refills | Status: DC

## 2021-01-11 NOTE — Telephone Encounter (Signed)
Ok PER [employer] ,valid dates are 01-08-21- 07/07/21 aut #[id number], REFERENCE [id number]

## 2021-01-25 ENCOUNTER — Ambulatory Visit: Payer: Managed Care, Other (non HMO) | Admitting: Cardiology

## 2021-02-09 ENCOUNTER — Ambulatory Visit: Payer: Managed Care, Other (non HMO) | Admitting: Cardiology

## 2021-02-09 ENCOUNTER — Encounter: Payer: Self-pay | Admitting: Cardiology

## 2021-02-09 ENCOUNTER — Other Ambulatory Visit: Payer: Self-pay

## 2021-02-09 VITALS — BP 112/68 | HR 82 | Ht 76.0 in | Wt 181.6 lb

## 2021-02-09 DIAGNOSIS — G4733 Obstructive sleep apnea (adult) (pediatric): Secondary | ICD-10-CM

## 2021-02-09 DIAGNOSIS — I480 Paroxysmal atrial fibrillation: Secondary | ICD-10-CM | POA: Insufficient documentation

## 2021-02-09 NOTE — Progress Notes (Signed)
Cardiology Office Note:    Date:  02/09/2021   ID:  Kent Gibbs, DOB [birth year], MRN [id number]  PCP:  Pcp, No  Cardiologist:  None    Referring MD: No ref. provider found   Chief Complaint  Patient presents with   Sleep Apnea     History of Present Illness:    Kent Gibbs is a 30 y.o. male with a hx of PAF who was referred for evaluation of OSA due to PAF.  He denies any hx of snoring, witnessed apnea or awakening gasping for breath.  He underwent home sleep study which showed mild OSA with an AHI of 8/hr and was started on auto CPAP and is now back for followup.  He has had a hard time getting adjusted to the CPAP.  He says that he is not used to having something on his face when he tries to sleep.  He is a stomach sleeper so it is hard to sleep on his side or back.  He does admit to feeling tired in the am but typically does not get 8 hours of sleep.  He usually goes to bed around MN or 1am and gets up around 8:30am and does not get up during the night.   Past Medical History:  Diagnosis Date   OSA (obstructive sleep apnea)    mild with AHI 8/hr>>on CPAP   PAF (paroxysmal atrial fibrillation) (HCC)     History reviewed. No pertinent surgical history.  Current Medications: Current Meds  Medication Sig   metoprolol succinate (TOPROL XL) 25 MG 24 hr tablet Take 1 tablet (25 mg total) by mouth daily.     Allergies:   Patient has no known allergies.   Social History   Socioeconomic History   Marital status: Single    Spouse name: Not on file   Number of children: Not on file   Years of education: Not on file   Highest education level: Not on file  Occupational History   Not on file  Tobacco Use   Smoking status: Never   Smokeless tobacco: Never  Vaping Use   Vaping Use: Never used  Substance and Sexual Activity   Alcohol use: Never   Drug use: Never   Sexual activity: Not on file  Other Topics Concern   Not on file  Social History Narrative   Not on file    Social Determinants of Health   Financial Resource Strain: Not on file  Food Insecurity: Not on file  Transportation Needs: Not on file  Physical Activity: Not on file  Stress: Not on file  Social Connections: Not on file     Family History: The patient's family history is not on file.  ROS:   Please see the history of present illness.    ROS  All other systems reviewed and negative.   EKGs/Labs/Other Studies Reviewed:    The following studies were reviewed today: Home sleep study, PAP download  EKG:  EKG is not ordered today.    Recent Labs: 08/02/2020: BUN 10; Creatinine, Ser 1.05; Hemoglobin 16.5; Magnesium 2.1; Platelets 187; Potassium 3.1; Sodium 139; TSH 3.755   Recent Lipid Panel No results found for: CHOL, TRIG, HDL, CHOLHDL, VLDL, LDLCALC, LDLDIRECT   Physical Exam:    VS:  BP 112/68   Pulse 82   Ht 6\' 4"  (1.93 m)   Wt 181 lb 9.6 oz (82.4 kg)   SpO2 99%   BMI 22.11 kg/m     Wt Readings  from Last 3 Encounters:  02/09/21 181 lb 9.6 oz (82.4 kg)  09/25/20 176 lb (79.8 kg)  09/09/20 177 lb 8 oz (80.5 kg)     GEN:  Well nourished, well developed in no acute distress HEENT: Normal NECK: No JVD; No carotid bruits LYMPHATICS: No lymphadenopathy CARDIAC: RRR, no murmurs, rubs, gallops RESPIRATORY:  Clear to auscultation without rales, wheezing or rhonchi  ABDOMEN: Soft, non-tender, non-distended MUSCULOSKELETAL:  No edema; No deformity  SKIN: Warm and dry NEUROLOGIC:  Alert and oriented x 3 PSYCHIATRIC:  Normal affect   ASSESSMENT:    1. OSA (obstructive sleep apnea)    PLAN:    In order of problems listed above:   OSA - The patient is tolerating PAP therapy well without any problems. The PAP download performed by his DME was personally reviewed and interpreted by me today and showed an AHI of 1.5/hr on auto PAP and 0% compliance in using more than 4 hours nightly.  The patient has been using and benefiting from PAP use and will continue to  benefit from therapy.  -I encouarged him to be more compliant with his device  Time Spent: 20 minutes total time of encounter, including 15 minutes spent in face-to-face patient care on the date of this encounter. This time includes coordination of care and counseling regarding above mentioned problem list. Remainder of non-face-to-face time involved reviewing chart documents/testing relevant to the patient encounter and documentation in the medical record. I have independently reviewed documentation from referring provider  Medication Adjustments/Labs and Tests Ordered: Current medicines are reviewed at length with the patient today.  Concerns regarding medicines are outlined above.  No orders of the defined types were placed in this encounter.  No orders of the defined types were placed in this encounter.  Followup with me in 2 months  Signed, Armanda Magic, MD  02/09/2021 10:16 AM    [facility]

## 2021-02-09 NOTE — Patient Instructions (Signed)
Medication Instructions:  Your physician recommends that you continue on your current medications as directed. Please refer to the Current Medication list given to you today.  *If you need a refill on your cardiac medications before your next appointment, please call your pharmacy*  Follow-Up: At [facility], you and your health needs are our priority.  As part of our continuing mission to provide you with exceptional heart care, we have created designated Provider Care Teams.  These Care Teams include your primary Cardiologist (physician) and Advanced Practice Providers (APPs -  Physician Assistants and Nurse Practitioners) who all work together to provide you with the care you need, when you need it.  Your next appointment:   2 month(s)  The format for your next appointment:   Virtual Visit   Provider:   Traci Turner, MD  

## 2021-03-10 ENCOUNTER — Ambulatory Visit: Payer: Managed Care, Other (non HMO) | Admitting: Cardiology

## 2021-03-30 NOTE — Progress Notes (Signed)
Electrophysiology Office Follow up Visit Note:    Date:  03/31/2021   ID:  Kent Gibbs, DOB [birth year], MRN [id number]  PCP:  Pcp, No  [facility] Cardiologist:  None  [facility] Electrophysiologist:  Lanier Prude, MD    Interval History:    Kent Gibbs is a 30 y.o. male who presents for a follow up visit. They were last seen in clinic September 09, 2020 for paroxysmal atrial fibrillation.  He had a 10% burden on a ZIO monitor. After appointment, we decided to continue with a conservative medical management strategy given he had not had an episode of atrial fibrillation in 1-1/2 weeks at the time of our appointment.  We plan to discuss his burden at a follow-up appointment today.  We also ordered a sleep study. The sleep study did return positive for obstructive sleep apnea and he was started on an auto CPAP.  On review of his medical equipment, he had a 0% compliance and using his CPAP machine.  Today he tells me he has not had a recurrence of his atrial fibrillation.  He is done well.  He did have a sleep study which showed mild sleep apnea and he has started CPAP using a nasal pillow CPAP mask.    Past Medical History:  Diagnosis Date   OSA (obstructive sleep apnea)    mild with AHI 8/hr>>on CPAP   PAF (paroxysmal atrial fibrillation) (HCC)     History reviewed. No pertinent surgical history.  Current Medications: No outpatient medications have been marked as taking for the 03/31/21 encounter (Office Visit) with Lanier Prude, MD.     Allergies:   Patient has no known allergies.   Social History   Socioeconomic History   Marital status: Single    Spouse name: Not on file   Number of children: Not on file   Years of education: Not on file   Highest education level: Not on file  Occupational History   Not on file  Tobacco Use   Smoking status: Never   Smokeless tobacco: Never  Vaping Use   Vaping Use: Never used  Substance and Sexual Activity    Alcohol use: Never   Drug use: Never   Sexual activity: Not on file  Other Topics Concern   Not on file  Social History Narrative   Not on file   Social Determinants of Health   Financial Resource Strain: Not on file  Food Insecurity: Not on file  Transportation Needs: Not on file  Physical Activity: Not on file  Stress: Not on file  Social Connections: Not on file     Family History: The patient's family history is not on file.  ROS:   Please see the history of present illness.    All other systems reviewed and are negative.  EKGs/Labs/Other Studies Reviewed:    The following studies were reviewed today: Sleep study results  EKG:  The ekg ordered today demonstrates sinus arrhythmia  Recent Labs: 08/02/2020: BUN 10; Creatinine, Ser 1.05; Hemoglobin 16.5; Magnesium 2.1; Platelets 187; Potassium 3.1; Sodium 139; TSH 3.755  Recent Lipid Panel No results found for: CHOL, TRIG, HDL, CHOLHDL, VLDL, LDLCALC, LDLDIRECT  Physical Exam:    VS:  Ht 6\' 4"  (1.93 m)   BMI 22.11 kg/m     Wt Readings from Last 3 Encounters:  02/09/21 181 lb 9.6 oz (82.4 kg)  09/25/20 176 lb (79.8 kg)  09/09/20 177 lb 8 oz (80.5 kg)     GEN:  Well nourished, well developed in no acute distress HEENT: Normal NECK: No JVD; No carotid bruits LYMPHATICS: No lymphadenopathy CARDIAC: RRR, no murmurs, rubs, gallops RESPIRATORY:  Clear to auscultation without rales, wheezing or rhonchi  ABDOMEN: Soft, non-tender, non-distended MUSCULOSKELETAL:  No edema; No deformity  SKIN: Warm and dry NEUROLOGIC:  Alert and oriented x 3 PSYCHIATRIC:  Normal affect        ASSESSMENT:    1. Paroxysmal atrial fibrillation (HCC)   2. OSA (obstructive sleep apnea)    PLAN:    In order of problems listed above:  #Paroxysmal atrial fibrillation No recurrence.  At this point, would favor a watchful waiting approach especially in light of his OSA diagnosis and recent initiation of CPAP.  If he has recurrence  of atrial arrhythmia, he will reach out to [place] and we can discuss next steps.   #Sleep apnea Continue CPAP.  Discussed the length between sleep apnea and A. fib during today's appointment.  Follow-up as needed.   Medication Adjustments/Labs and Tests Ordered: Current medicines are reviewed at length with the patient today.  Concerns regarding medicines are outlined above.  Orders Placed This Encounter  Procedures   EKG 12-Lead   No orders of the defined types were placed in this encounter.    Signed, Steffanie Dunn, MD, [facility], [facility] 03/31/2021 3:26 PM    Electrophysiology [facility]

## 2021-03-31 ENCOUNTER — Ambulatory Visit: Payer: Managed Care, Other (non HMO) | Admitting: Cardiology

## 2021-03-31 ENCOUNTER — Encounter: Payer: Self-pay | Admitting: Cardiology

## 2021-03-31 ENCOUNTER — Other Ambulatory Visit: Payer: Self-pay

## 2021-03-31 VITALS — BP 124/80 | HR 86 | Ht 76.0 in | Wt 179.0 lb

## 2021-03-31 DIAGNOSIS — I480 Paroxysmal atrial fibrillation: Secondary | ICD-10-CM | POA: Diagnosis not present

## 2021-03-31 DIAGNOSIS — G4733 Obstructive sleep apnea (adult) (pediatric): Secondary | ICD-10-CM | POA: Diagnosis not present

## 2021-03-31 NOTE — Patient Instructions (Addendum)

## 2021-04-25 NOTE — Progress Notes (Signed)
Virtual Visit via Video Note   This visit type was conducted due to national recommendations for restrictions regarding the COVID-19 Pandemic (e.g. social distancing) in an effort to limit this patient's exposure and mitigate transmission in our community.  Due to his co-morbid illnesses, this patient is at least at moderate risk for complications without adequate follow up.  This format is felt to be most appropriate for this patient at this time.  All issues noted in this document were discussed and addressed.  A limited physical exam was performed with this format.  Please refer to the patient's chart for his consent to telehealth for [facility].       Date:  04/26/2021   ID:  Kent Gibbs, DOB November 28, 1990, MRN [id number] The patient was identified using 2 identifiers.  Patient Location: Home Provider Location: Home Office   PCP:  Pcp, No   [facility] Providers Cardiologist:  None Electrophysiologist:  Lanier Prude, MD  Sleep Medicine:  Armanda Magic, MD     Evaluation Performed:  Follow-Up Visit  Chief Complaint:  OSA  History of Present Illness:    Kent Gibbs is a 30 y.o. male with a hx of PAF who was referred for evaluation of OSA due to PAF.  He denies any hx of snoring, witnessed apnea or awakening gasping for breath.  He underwent home sleep study which showed mild OSA with an AHI of 8/hr and was started on auto CPAP.Marland Kitchen I saw him back in August 2022 and had not been compliant with his device because he is a Surveyor, [employer] and it was hard to get adjusted to his device.    He is now back for followup.  He is still struggling with getting adjusted to his device due to sleeping on his stomach.  He has started dong better with it over the past week or so.  The pressure is fine.  The patient does not have symptoms concerning for COVID-19 infection (fever, chills, cough, or new shortness of breath).    Past Medical History:  Diagnosis Date   OSA (obstructive  sleep apnea)    mild with AHI 8/hr>>on CPAP   PAF (paroxysmal atrial fibrillation) (HCC)    No past surgical history on file.   No outpatient medications have been marked as taking for the 04/26/21 encounter (Video Visit) with Quintella Reichert, MD.     Allergies:   Patient has no known allergies.   Social History   Tobacco Use   Smoking status: Never   Smokeless tobacco: Never  Vaping Use   Vaping Use: Never used  Substance Use Topics   Alcohol use: Never   Drug use: Never     Family Hx: The patient's family history is not on file.  ROS:   Please see the history of present illness.     All other systems reviewed and are negative.   Prior CV studies:   The following studies were reviewed today:  PAP compliance download  Labs/Other Tests and Data Reviewed:    EKG:  No ECG reviewed.  Recent Labs: 08/02/2020: BUN 10; Creatinine, Ser 1.05; Hemoglobin 16.5; Magnesium 2.1; Platelets 187; Potassium 3.1; Sodium 139; TSH 3.755   Recent Lipid Panel No results found for: CHOL, TRIG, HDL, CHOLHDL, LDLCALC, LDLDIRECT  Wt Readings from Last 3 Encounters:  04/26/21 170 lb (77.1 kg)  03/31/21 179 lb (81.2 kg)  02/09/21 181 lb 9.6 oz (82.4 kg)      Objective:    Vital  Signs:  Ht 6\' 4"  (1.93 m)   Wt 170 lb (77.1 kg)   BMI 20.69 kg/m    VITAL SIGNS:  reviewed GEN:  no acute distress EYES:  sclerae anicteric, EOMI - Extraocular Movements Intact RESPIRATORY:  normal respiratory effort, symmetric expansion CARDIOVASCULAR:  no peripheral edema SKIN:  no rash, lesions or ulcers. MUSCULOSKELETAL:  no obvious deformities. NEURO:  alert and oriented x 3, no obvious focal deficit PSYCH:  normal affect  ASSESSMENT & PLAN:    OSA - he is still having difficulty getting used to the mask due to being a stomach sleeper. The PAP download performed by his DME was personally reviewed and interpreted by me today and showed an AHI of 3.2/hr on auto PAP  with 10% compliance in using  more than 4 hours nightly.  The patient has been using and benefiting from PAP use and will continue to benefit from therapy.  -we discussed other options for treatment of OSA today including Inspire and oral device -he is not a candidate for Inspire and he only has mild OSA and needs an AHI>15/hr to qualify -his best option would be referral to dentistry for oral device evaluation -he wants to try to continue to use the device and thinks that he can get compliant  COVID-19 Education: The signs and symptoms of COVID-19 were discussed with the patient and how to seek care for testing (follow up with PCP or arrange E-visit).  The importance of social distancing was discussed today.  Time:   Today, I have spent 15 minutes with the patient with telehealth technology discussing the above problems.     Medication Adjustments/Labs and Tests Ordered: Current medicines are reviewed at length with the patient today.  Concerns regarding medicines are outlined above.   Tests Ordered: No orders of the defined types were placed in this encounter.   Medication Changes: No orders of the defined types were placed in this encounter.   Follow Up:  Virtual Visit  in 4 month(s)  Signed, , MD  04/26/2021 10:58 AM    [facility]

## 2021-04-26 ENCOUNTER — Other Ambulatory Visit: Payer: Self-pay

## 2021-04-26 ENCOUNTER — Encounter: Payer: Self-pay | Admitting: Cardiology

## 2021-04-26 ENCOUNTER — Telehealth (INDEPENDENT_AMBULATORY_CARE_PROVIDER_SITE_OTHER): Payer: Managed Care, Other (non HMO) | Admitting: Cardiology

## 2021-04-26 VITALS — Ht 76.0 in | Wt 170.0 lb

## 2021-04-26 DIAGNOSIS — G4733 Obstructive sleep apnea (adult) (pediatric): Secondary | ICD-10-CM | POA: Diagnosis not present

## 2021-04-26 NOTE — Patient Instructions (Signed)
Medication Instructions:  Your physician recommends that you continue on your current medications as directed. Please refer to the Current Medication list given to you today.  *If you need a refill on your cardiac medications before your next appointment, please call your pharmacy*  Follow-Up: At [facility], you and your health needs are our priority.  As part of our continuing mission to provide you with exceptional heart care, we have created designated Provider Care Teams.  These Care Teams include your primary Cardiologist (physician) and Advanced Practice Providers (APPs -  Physician Assistants and Nurse Practitioners) who all work together to provide you with the care you need, when you need it.   Your next appointment:   4 month(s)  The format for your next appointment:   In Person  Provider:   Armanda Magic, MD

## 2021-08-25 NOTE — Progress Notes (Signed)
? ?Virtual Visit via Video Note  ? ?This visit type was conducted due to national recommendations for restrictions regarding the COVID-19 Pandemic (e.g. social distancing) in an effort to limit this patient's exposure and mitigate transmission in our community.  Due to his co-morbid illnesses, this patient is at least at moderate risk for complications without adequate follow up.  This format is felt to be most appropriate for this patient at this time.  All issues noted in this document were discussed and addressed.  A limited physical exam was performed with this format.  Please refer to the patient's chart for his consent to telehealth for [facility] ? ?  ?Date:  08/26/2021  ? ?ID:  Kent Gibbs, DOB 02-01-1991, MRN [id number] ?The patient was identified using 2 identifiers. ? ?Patient Location: Home ?Provider Location: Home Office ? ? ?PCP:  Pcp, No ?  ?[facility] Providers ?Cardiologist:  None ?Electrophysiologist:  Lanier Prude, MD  ?Sleep Medicine:  Armanda Magic, MD    ? ?Evaluation Performed:  Follow-Up Visit ? ?Chief Complaint:  OSA ? ?History of Present Illness:   ? ?Kent Gibbs is a 31 y.o. male with a hx of PAF who was referred for evaluation of OSA due to PAF.  He denies any hx of snoring, witnessed apnea or awakening gasping for breath.  He underwent home sleep study which showed mild OSA with an AHI of 8/hr and was started on auto CPAP.  He has had a hard time getting adjusted to using CPAP.  He is not a candidate for Inspire as his AHI is < 15/hr.  ? ?He is doing well with his CPAP device and thinks that he has gotten used to it.  He tolerates the mask and feels the pressure is adequate.  Since going on CPAP he feels rested in the am and has no significant daytime sleepiness.  He denies any significant mouth or nasal dryness or nasal congestion.  He does not think that he snores.    ? ?Past Medical History:  ?Diagnosis Date  ? OSA (obstructive sleep apnea)   ? mild with AHI 8/hr>>on  CPAP  ? PAF (paroxysmal atrial fibrillation) (HCC)   ? ?No past surgical history on file.  ? ?No outpatient medications have been marked as taking for the 08/26/21 encounter (Video Visit) with Quintella Reichert, MD.  ?  ? ?Allergies:   Patient has no known allergies.  ? ?Social History  ? ?Tobacco Use  ? Smoking status: Never  ? Smokeless tobacco: Never  ?Vaping Use  ? Vaping Use: Never used  ?Substance Use Topics  ? Alcohol use: Never  ? Drug use: Never  ?  ? ?Family Hx: ?The patient's family history is not on file. ? ?ROS:   ?Please see the history of present illness.    ? ?All other systems reviewed and are negative. ? ? ?Prior CV studies:   ?The following studies were reviewed today: ? ?PAP compliance download ? ?Labs/Other Tests and Data Reviewed:   ? ?EKG:  No ECG reviewed. ? ?Recent Labs: ?No results found for requested labs within last 8760 hours.  ? ?Recent Lipid Panel ?No results found for: CHOL, TRIG, HDL, CHOLHDL, LDLCALC, LDLDIRECT ? ?Wt Readings from Last 3 Encounters:  ?08/26/21 170 lb (77.1 kg)  ?04/26/21 170 lb (77.1 kg)  ?03/31/21 179 lb (81.2 kg)  ?  ? ? ?Objective:   ? ?Vital Signs:  Pulse 87   Ht 6\' 4"  (1.93 m)   Wt  170 lb (77.1 kg)   BMI 20.69 kg/m?   ?Well nourished, well developed male in no acute distress. ?Well appearing, alert and conversant, regular work of breathing,  good skin color  ?Eyes- anicteric ?mouth- oral mucosa is pink  ?neuro- grossly intact ?skin- no apparent rash or lesions or cyanosis  ?ASSESSMENT & PLAN:   ? ?OSA - The patient is tolerating PAP therapy well without any problems. The PAP download performed by his DME was personally reviewed and interpreted by me today and showed an AHI of 1.9/hr on auto CPAP with 23% compliance in using more than 4 hours nightly.  The patient has been using and benefiting from PAP use and will continue to benefit from therapy.  ?-encouraged him to be more compliant with his device ? ?PAF ?-maintaining NSR at home with a little breakthrough  recently when not using his PAP as much but that has resolved ? ?  ?COVID-19 Education: ?The signs and symptoms of COVID-19 were discussed with the patient and how to seek care for testing (follow up with PCP or arrange E-visit).  The importance of social distancing was discussed today. ? ?Time:   ?Today, I have spent 10 minutes with the patient with telehealth technology discussing the above problems.   ? ? ?Medication Adjustments/Labs and Tests Ordered: ?Current medicines are reviewed at length with the patient today.  Concerns regarding medicines are outlined above.  ? ?Tests Ordered: ?No orders of the defined types were placed in this encounter. ? ? ? ?Medication Changes: ?No orders of the defined types were placed in this encounter. ? ? ? ?Follow Up:  Virtual Visit  1 year ? ?Signed, ?Armanda Magic, MD  ?08/26/2021 10:25 AM    [facility] ?  ?

## 2021-08-26 ENCOUNTER — Other Ambulatory Visit: Payer: Self-pay

## 2021-08-26 ENCOUNTER — Encounter: Payer: Self-pay | Admitting: Cardiology

## 2021-08-26 ENCOUNTER — Telehealth: Payer: Self-pay | Admitting: *Deleted

## 2021-08-26 ENCOUNTER — Telehealth (INDEPENDENT_AMBULATORY_CARE_PROVIDER_SITE_OTHER): Payer: Managed Care, Other (non HMO) | Admitting: Cardiology

## 2021-08-26 VITALS — HR 87 | Ht 76.0 in | Wt 170.0 lb

## 2021-08-26 DIAGNOSIS — I480 Paroxysmal atrial fibrillation: Secondary | ICD-10-CM

## 2021-08-26 DIAGNOSIS — G4733 Obstructive sleep apnea (adult) (pediatric): Secondary | ICD-10-CM

## 2021-08-26 NOTE — Telephone Encounter (Signed)
Order placed to [employer] via community message 

## 2021-08-26 NOTE — Patient Instructions (Signed)

## 2021-08-26 NOTE — Telephone Encounter (Signed)
-----   Message from Antonieta Iba, RN sent at 08/26/2021 10:29 AM EDT ----- ?Per Dr. Radford Pax: ?place order for new pap supplies for the year ?Thanks! ? ?

## 2022-07-14 ENCOUNTER — Telehealth: Payer: Self-pay | Admitting: Cardiology

## 2022-07-14 NOTE — Telephone Encounter (Signed)
Noted  

## 2022-07-14 NOTE — Telephone Encounter (Signed)
We have made several attempts to schedule with no success Deleting recall

## 2022-11-05 IMAGING — CR DG CHEST 2V
3 series · 3 of 3 positions shown · non-contrast
Comparison: None.

CLINICAL DATA: Acute tachycardia and palpitations.

EXAM:
CHEST - 2 VIEW

[chest pa]
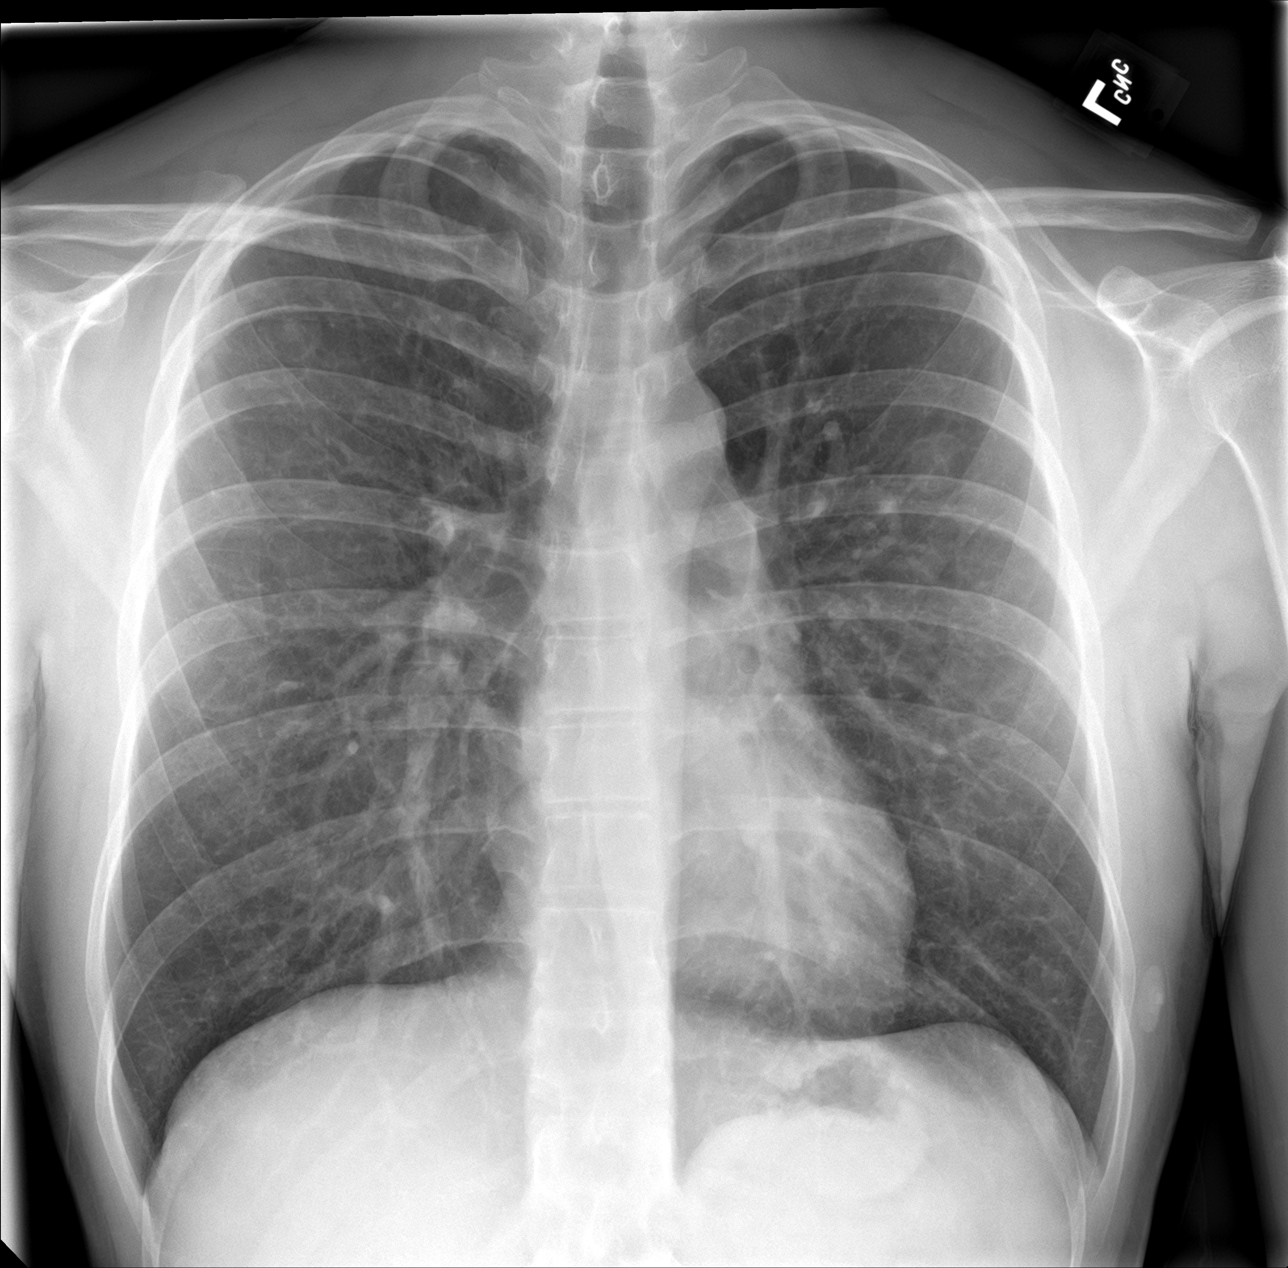

[chest lat (1 of 2)]
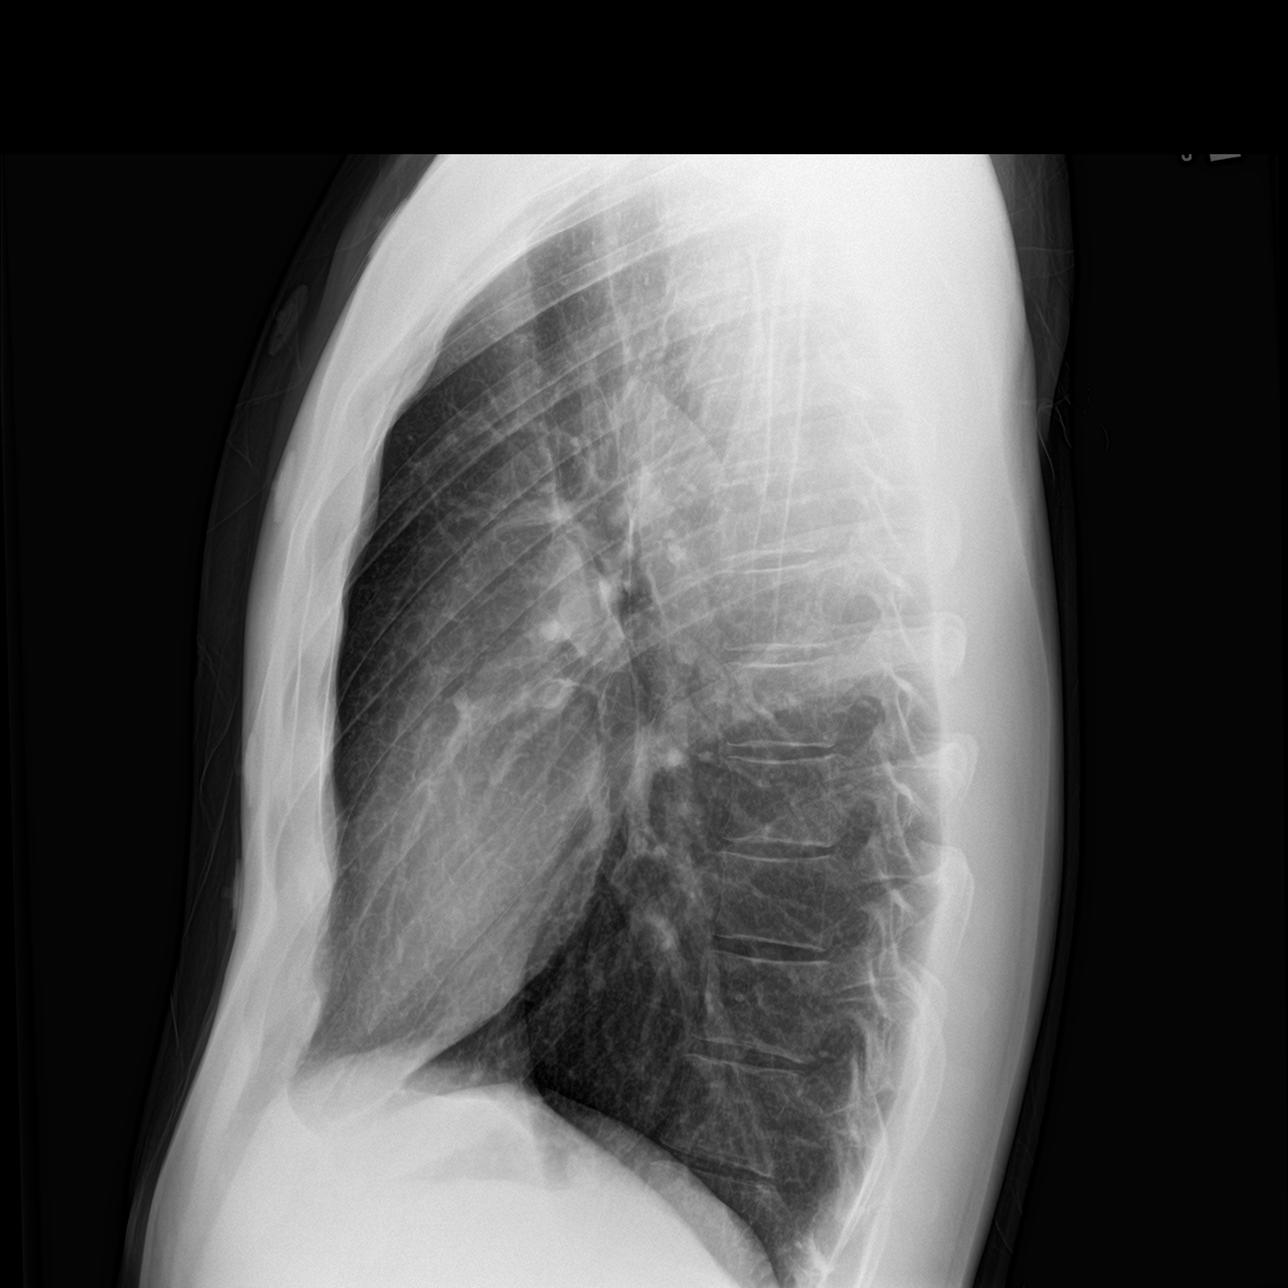

[chest lat (2 of 2)]
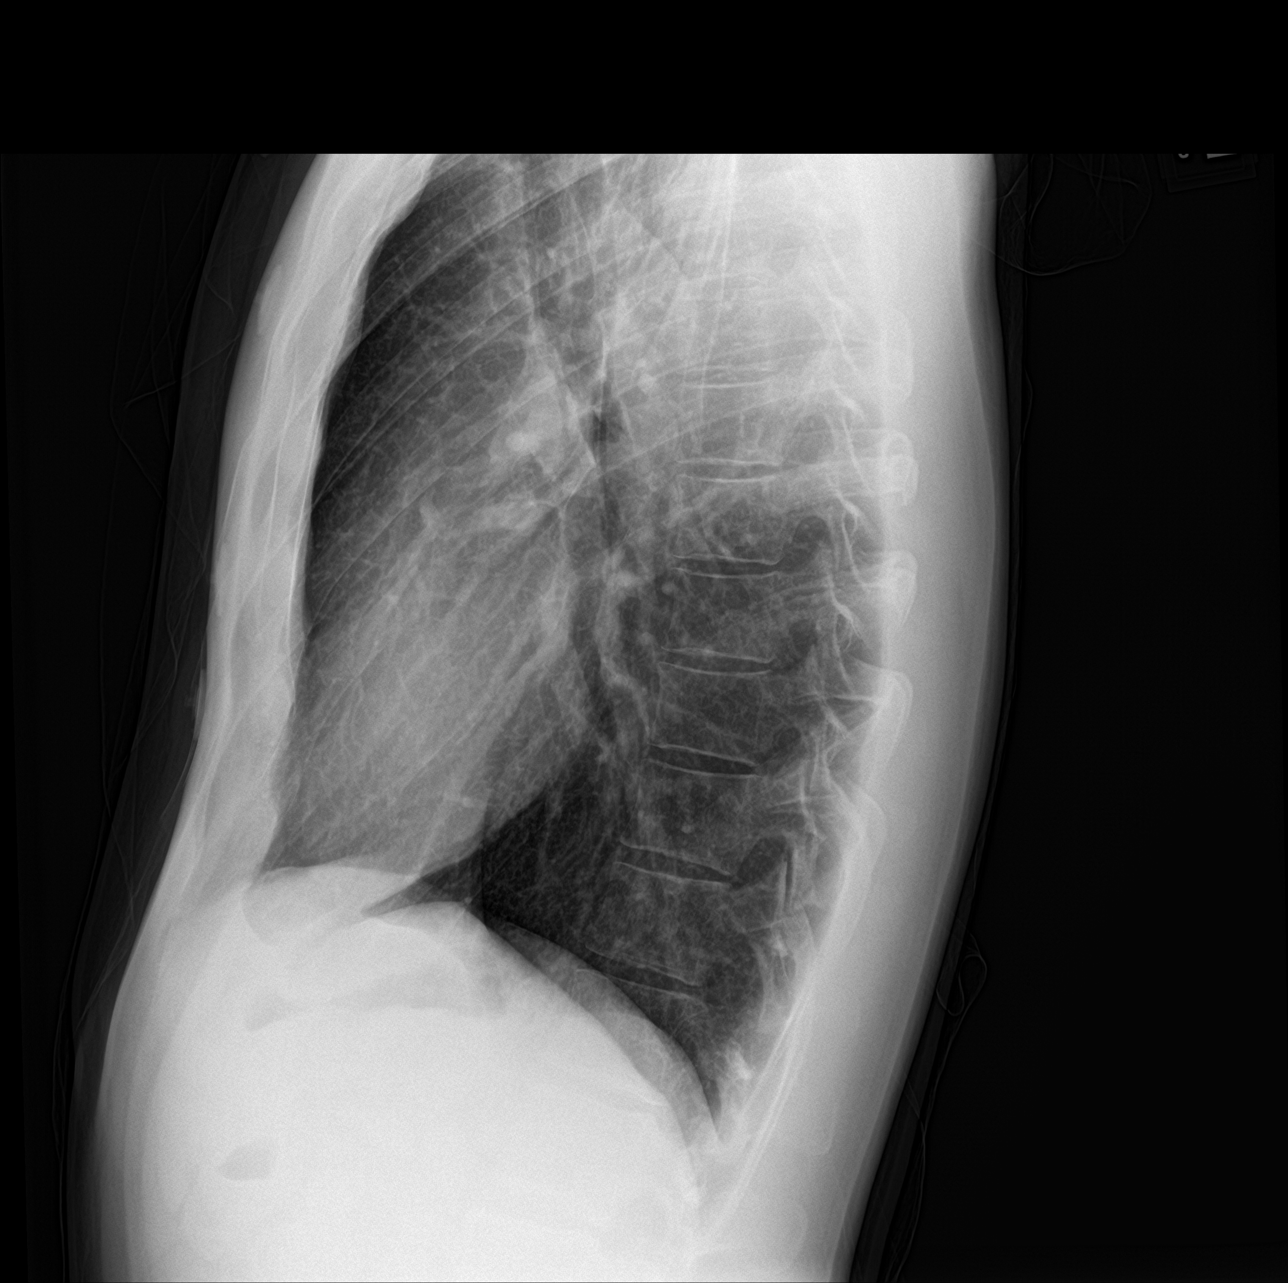

[3 of 3 positions shown; findings below may reference images not displayed]

FINDINGS: The cardiomediastinal silhouette is unremarkable.

There is no evidence of focal airspace disease, pulmonary edema,
suspicious pulmonary nodule/mass, pleural effusion, or pneumothorax.

No acute bony abnormalities are identified.
IMPRESSION: No active cardiopulmonary disease.
